# Patient Record
Sex: Male | Born: 2000 | Race: Black or African American | Hispanic: No | Marital: Single | Smoking: Never smoker
Health system: Southern US, Community
[De-identification: ages and names within clinical notes are randomized; demographics above are authoritative.]

## PROBLEM LIST (undated history)

## (undated) DIAGNOSIS — M8430XA Stress fracture, unspecified site, initial encounter for fracture: Secondary | ICD-10-CM

## (undated) HISTORY — PX: ADENOIDECTOMY: SUR15

## (undated) HISTORY — PX: MYRINGOTOMY WITH TUBE PLACEMENT: SHX5663

## (undated) HISTORY — PX: TYMPANOSTOMY TUBE PLACEMENT: SHX32

## (undated) HISTORY — PX: TONSILLECTOMY: SUR1361

---
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
## 2021-10-17 ENCOUNTER — Emergency Department
Admission: EM | Admit: 2021-10-17 | Discharge: 2021-10-17 | Disposition: A | Discharge status: Home / Self Care | Payer: BC Managed Care – PPO | Attending: Family Medicine | Admitting: Family Medicine

## 2021-10-17 DIAGNOSIS — H6123 Impacted cerumen, bilateral: Secondary | ICD-10-CM | POA: Diagnosis not present

## 2021-10-17 DIAGNOSIS — H6692 Otitis media, unspecified, left ear: Secondary | ICD-10-CM

## 2021-10-17 MED ORDER — AMOXICILLIN-POT CLAVULANATE 875-125 MG PO TABS: 1.0000 | ORAL_TABLET | Freq: Two times a day (BID) | ORAL | 0 refills | Status: AC

## 2021-10-17 NOTE — ED Provider Notes (Signed)
[facility]    CSN: [id number] Arrival date & time: 10/17/21  1147      History   Chief Complaint Chief Complaint  Patient presents with   Ear Fullness    HPI Ethan [facility] is a 21 y.o. male.   HPI Pleasant 21 year old male presents with bilateral ear fullness with decreased hearing for 1.5 weeks.  Denies ear pain.  Past Medical History:  Diagnosis Date   Stress fracture    left hip    Patient Active Problem List   Diagnosis Date Noted   Right wrist sprain 08/16/2013    Past Surgical History:  Procedure Laterality Date   ADENOIDECTOMY     MYRINGOTOMY WITH TUBE PLACEMENT     TONSILLECTOMY     TYMPANOSTOMY TUBE PLACEMENT     childhood       Home Medications    Prior to Admission medications   Medication Sig Start Date End Date Taking? Authorizing Provider  amoxicillin-clavulanate (AUGMENTIN) 875-125 MG tablet Take 1 tablet by mouth 2 (two) times daily for 7 days. 10/17/21 10/24/21 Yes Trevor Iha, FNP    Family History Family History  Problem Relation Age of Onset   Hypertension Mother    Healthy Father     Social History Social History   Tobacco Use   Smoking status: Never   Smokeless tobacco: Never  Vaping Use   Vaping Use: Never used  Substance Use Topics   Alcohol use: No   Drug use: No     Allergies   Patient has no known allergies.   Review of Systems Review of Systems  HENT:  Positive for ear pain.   All other systems reviewed and are negative.    Physical Exam Triage Vital Signs ED Triage Vitals  Enc Vitals Group     BP      Pulse      Resp      Temp      Temp src      SpO2      Weight      Height      Head Circumference      Peak Flow      Pain Score      Pain Loc      Pain Edu?      Excl. in GC?    No data found.  Updated Vital Signs BP (!) 145/82   Pulse (!) 55   Temp 98.9 F (37.2 C) (Oral)   Resp 18   Ht 5\' 9"  (1.753 m)   Wt 215 lb (97.5 kg)   SpO2 100%   BMI 31.75 kg/m     Physical Exam Vitals and nursing note reviewed.  Constitutional:      Appearance: Normal appearance. He is normal weight.  HENT:     Head: Normocephalic and atraumatic.     Right Ear: External ear normal.     Left Ear: External ear normal.     Ears:     Comments: Bilateral EACs occluded by excessive cerumen unable to visualize either TM; post bilateral ear lavage: EAC's are clear bilaterally; left TM-erythematous, bulging; right TM-clear, retracted, with scant serous effusions noted    Mouth/Throat:     Mouth: Mucous membranes are moist.     Pharynx: Oropharynx is clear.  Eyes:     Extraocular Movements: Extraocular movements intact.     Conjunctiva/sclera: Conjunctivae normal.     Pupils: Pupils are equal, round, and reactive to light.  Cardiovascular:  Rate and Rhythm: Normal rate and regular rhythm.     Pulses: Normal pulses.     Heart sounds: Normal heart sounds. No murmur heard. Pulmonary:     Effort: Pulmonary effort is normal.     Breath sounds: Normal breath sounds. No wheezing, rhonchi or rales.  Musculoskeletal:     Cervical back: Normal range of motion and neck supple.  Skin:    General: Skin is warm and dry.  Neurological:     General: No focal deficit present.     Mental Status: He is alert and oriented to person, place, and time. Mental status is at baseline.      UC Treatments / Results  Labs (all labs ordered are listed, but only abnormal results are displayed) Labs Reviewed - No data to display  EKG   Radiology No results found.  Procedures Procedures (including critical care time)  Medications Ordered in UC Medications - No data to display  Initial Impression / Assessment and Plan / UC Course  I have reviewed the triage vital signs and the nursing notes.  Pertinent labs & imaging results that were available during my care of the patient were reviewed by me and considered in my medical decision making (see chart for details).      MDM: 1.  Bilateral impacted cerumen-resolved with bilateral ear lavage; 2.  Acute left otitis media-Rx'd Augmentin. Advised/informed the earwax was completely cleared from both auditory canals after lavage.  Advised patient that he has a left middle ear infection and to take medication as directed with food to completion.  Encouraged patient increase daily water intake while taking this medication.  Advised patient if symptoms worsen and/or unresolved please follow-up with PCP or here for further evaluation.  Patient discharged home, hemodynamically stable Final Clinical Impressions(s) / UC Diagnoses   Final diagnoses:  Bilateral impacted cerumen  Acute left otitis media     Discharge Instructions      Advised/informed the earwax was completely cleared from both auditory canals after lavage.  Advised patient that he has a left middle ear infection and to take medication as directed with food to completion.  Encouraged patient increase daily water intake while taking this medication.  Advised patient if symptoms worsen and/or unresolved please follow-up with PCP or here for further evaluation.     ED Prescriptions     Medication Sig Dispense Auth. Provider   amoxicillin-clavulanate (AUGMENTIN) 875-125 MG tablet Take 1 tablet by mouth 2 (two) times daily for 7 days. 14 tablet Trevor Iha, FNP      PDMP not reviewed this encounter.   Trevor Iha, FNP 10/17/21 1303

## 2021-10-17 NOTE — Discharge Instructions (Addendum)
Advised/informed the earwax was completely cleared from both auditory canals after lavage.  Advised patient that he has a left middle ear infection and to take medication as directed with food to completion.  Encouraged patient increase daily water intake while taking this medication.  Advised patient if symptoms worsen and/or unresolved please follow-up with PCP or here for further evaluation.

## 2021-10-17 NOTE — ED Triage Notes (Signed)
Pt presents to Urgent Care with c/o bilateral ear fullness w/ decreased hearing x 1.5 weeks. Denies pain.

## 2021-10-18 ENCOUNTER — Telehealth: Payer: Self-pay

## 2021-10-18 NOTE — Telephone Encounter (Signed)
Follow-up call for yesterday's visit at [facility]. No answer, VM not set up.

## 2022-08-05 ENCOUNTER — Ambulatory Visit
Admission: EM | Admit: 2022-08-05 | Discharge: 2022-08-05 | Disposition: A | Discharge status: Home / Self Care | Payer: BC Managed Care – PPO | Attending: Emergency Medicine | Admitting: Emergency Medicine

## 2022-08-05 DIAGNOSIS — Z113 Encounter for screening for infections with a predominantly sexual mode of transmission: Secondary | ICD-10-CM | POA: Insufficient documentation

## 2022-08-05 NOTE — ED Triage Notes (Signed)
Pt here today for STD screening . Requesting only cytology swab. Denies sxs or known exposure.

## 2022-08-05 NOTE — Discharge Instructions (Signed)
The results of your STD testing today which screens for gonorrhea, chlamydia, and trichomonas will be made posted to your MyChart account once it is complete.  This typically takes 2 to 4 days.  Please abstain from sexual intercourse of any kind, vaginal, oral or anal, until you have received the results of your STD testing.     If any of your results are abnormal, you will receive a phone call regarding treatment.  Prescriptions, if any are needed, will be provided for you at your pharmacy.     Thank you for visiting urgent care today.  I appreciate the opportunity to participate in your care.  

## 2022-08-05 NOTE — ED Provider Notes (Signed)
[facility]    CSN: [id number] Arrival date & time: 08/05/22  1756    HISTORY  No chief complaint on file.  HPI Ethan Bishop is a pleasant, 22 y.o. male who presents to urgent care today. Pt here today for STD screening . Requesting only cytology swab. Denies sxs or known exposure.   The history is provided by the patient.   Past Medical History:  Diagnosis Date   Stress fracture    left hip   Patient Active Problem List   Diagnosis Date Noted   Right wrist sprain 08/16/2013   Past Surgical History:  Procedure Laterality Date   ADENOIDECTOMY     MYRINGOTOMY WITH TUBE PLACEMENT     TONSILLECTOMY     TYMPANOSTOMY TUBE PLACEMENT     childhood    Home Medications    Prior to Admission medications   Not on File    Family History Family History  Problem Relation Age of Onset   Hypertension Mother    Healthy Father    Social History Social History   Tobacco Use   Smoking status: Never   Smokeless tobacco: Never  Vaping Use   Vaping Use: Never used  Substance Use Topics   Alcohol use: No   Drug use: No   Allergies   Patient has no known allergies.  Review of Systems Review of Systems Pertinent findings revealed after performing a 14 point review of systems has been noted in the history of present illness.  Physical Exam Vital Signs BP 133/80 (BP Location: Right Arm)   Pulse 69   Temp 98.7 F (37.1 C) (Oral)   Resp 16   SpO2 100%   No data found.  Physical Exam Vitals and nursing note reviewed.  Constitutional:      General: He is not in acute distress.    Appearance: Normal appearance. He is not ill-appearing.  HENT:     Head: Normocephalic and atraumatic.  Eyes:     General: Lids are normal.        Right eye: No discharge.        Left eye: No discharge.     Extraocular Movements: Extraocular movements intact.     Conjunctiva/sclera: Conjunctivae normal.     Right eye: Right conjunctiva is not injected.     Left eye:  Left conjunctiva is not injected.  Neck:     Trachea: Trachea and phonation normal.  Cardiovascular:     Rate and Rhythm: Normal rate and regular rhythm.     Pulses: Normal pulses.     Heart sounds: Normal heart sounds. No murmur heard.    No friction rub. No gallop.  Pulmonary:     Effort: Pulmonary effort is normal. No accessory muscle usage, prolonged expiration or respiratory distress.     Breath sounds: Normal breath sounds. No stridor, decreased air movement or transmitted upper airway sounds. No decreased breath sounds, wheezing, rhonchi or rales.  Chest:     Chest wall: No tenderness.  Genitourinary:    Comments: Pt politely declines GU exam, pt did provide a penile swab for testing.   Musculoskeletal:        General: Normal range of motion.     Cervical back: Normal range of motion and neck supple. Normal range of motion.  Lymphadenopathy:     Cervical: No cervical adenopathy.  Skin:    General: Skin is warm and dry.     Findings: No erythema or rash.  Neurological:  General: No focal deficit present.     Mental Status: He is alert and oriented to person, place, and time.  Psychiatric:        Mood and Affect: Mood normal.        Behavior: Behavior normal.     Visual Acuity Right Eye Distance:   Left Eye Distance:   Bilateral Distance:    Right Eye Near:   Left Eye Near:    Bilateral Near:     UC Couse / Diagnostics / Procedures:     Radiology No results found.  Procedures Procedures (including critical care time) EKG  Pending results:  Labs Reviewed  CYTOLOGY, (ORAL, ANAL, URETHRAL) ANCILLARY ONLY    Medications Ordered in UC: Medications - No data to display  UC Diagnoses / Final Clinical Impressions(s)   I have reviewed the triage vital signs and the nursing notes.  Pertinent labs & imaging results that were available during my care of the patient were reviewed by me and considered in my medical decision making (see chart for details).     Final diagnoses:  Screening examination for STD (sexually transmitted disease)   STD screening was performed, patient advised that the results be posted to their MyChart and if any of the results are positive, they will be notified by phone, further treatment will be provided as indicated based on results of STD screening. Patient was advised to abstain from sexual intercourse until that they receive the results of their STD testing.  Patient was also advised to use condoms to protect themselves from STD exposure.  Please see discharge instructions below for details of plan of care as provided to patient. ED Prescriptions   None    PDMP not reviewed this encounter.  Disposition Upon Discharge:  Condition: stable for discharge home  Patient presented with concern for an acute illness with associated systemic symptoms and significant discomfort requiring urgent management. In my opinion, this is a condition that a prudent lay person (someone who possesses an average knowledge of health and medicine) may potentially expect to result in complications if not addressed urgently such as respiratory distress, impairment of bodily function or dysfunction of bodily organs.   As such, the patient has been evaluated and assessed, work-up was performed and treatment was provided in alignment with urgent care protocols and evidence based medicine.  Patient/parent/caregiver has been advised that the patient may require follow up for further testing and/or treatment if the symptoms continue in spite of treatment, as clinically indicated and appropriate.  Routine symptom specific, illness specific and/or disease specific instructions were discussed with the patient and/or caregiver at length.  Prevention strategies for avoiding STD exposure were also discussed.  The patient will follow up with their current PCP if and as advised. If the patient does not currently have a PCP we will assist them in obtaining  one.   The patient may need specialty follow up if the symptoms continue, in spite of conservative treatment and management, for further workup, evaluation, consultation and treatment as clinically indicated and appropriate.  Patient/parent/caregiver verbalized understanding and agreement of plan as discussed.  All questions were addressed during visit.  Please see discharge instructions below for further details of plan.  Discharge Instructions:   Discharge Instructions      The results of your STD testing today which screens for gonorrhea, chlamydia, and trichomonas will be made posted to your MyChart account once it is complete.  This typically takes 2 to 4 days.  Please  abstain from sexual intercourse of any kind, vaginal, oral or anal, until you have received the results of your STD testing.     If any of your results are abnormal, you will receive a phone call regarding treatment.  Prescriptions, if any are needed, will be provided for you at your pharmacy.     Thank you for visiting urgent care today.  I appreciate the opportunity to participate in your care.       This office note has been dictated using Teaching laboratory technician.  Unfortunately, this method of dictation can sometimes lead to typographical or grammatical errors.  I apologize for your inconvenience in advance if this occurs.  Please do not hesitate to reach out to me if clarification is needed.       Theadora Rama Scales, [place] 08/05/22 1835

## 2022-08-07 LAB — CYTOLOGY, (ORAL, ANAL, URETHRAL) ANCILLARY ONLY
Chlamydia: NEGATIVE
Comment: NEGATIVE
Comment: NEGATIVE
Comment: NORMAL
Neisseria Gonorrhea: NEGATIVE
Trichomonas: NEGATIVE

## 2024-02-25 ENCOUNTER — Encounter: Payer: Self-pay | Admitting: Emergency Medicine

## 2024-02-25 ENCOUNTER — Ambulatory Visit
Admission: EM | Admit: 2024-02-25 | Discharge: 2024-02-25 | Disposition: A | Discharge status: Home / Self Care | Attending: Family Medicine | Admitting: Family Medicine

## 2024-02-25 DIAGNOSIS — Z202 Contact with and (suspected) exposure to infections with a predominantly sexual mode of transmission: Secondary | ICD-10-CM | POA: Diagnosis not present

## 2024-02-25 DIAGNOSIS — Z9189 Other specified personal risk factors, not elsewhere classified: Secondary | ICD-10-CM | POA: Insufficient documentation

## 2024-02-25 NOTE — ED Triage Notes (Signed)
 Patient requesting STI testing.  Denies any penile discharge or irritation.

## 2024-02-25 NOTE — Discharge Instructions (Signed)
 Your test have been sent to the laboratory for analysis The results will be available in MyChart You will be called if any of the tests are positive

## 2024-02-25 NOTE — ED Provider Notes (Signed)
 [facility]    CSN: [id number] Arrival date & time: 02/25/24  1210      History   Chief Complaint Chief Complaint  Patient presents with   Exposure to STD    HPI Ethan Bishop is a 23 y.o. male.   HPI  Patient is undergoing new relationship and desires STD testing.  He has no symptoms.  He does endorse unprotected sexual relations that place and at risk  Past Medical History:  Diagnosis Date   Stress fracture    left hip    Patient Active Problem List   Diagnosis Date Noted   Right wrist sprain 08/16/2013    Past Surgical History:  Procedure Laterality Date   ADENOIDECTOMY     MYRINGOTOMY WITH TUBE PLACEMENT     TONSILLECTOMY     TYMPANOSTOMY TUBE PLACEMENT     childhood       Home Medications    Prior to Admission medications   Not on File    Family History Family History  Problem Relation Age of Onset   Hypertension Mother    Healthy Father     Social History Social History   Tobacco Use   Smoking status: Never   Smokeless tobacco: Never  Vaping Use   Vaping status: Never Used  Substance Use Topics   Alcohol use: No   Drug use: Yes    Types: Marijuana     Allergies   Patient has no known allergies.   Review of Systems Review of Systems See HPI  Physical Exam Triage Vital Signs ED Triage Vitals  Encounter Vitals Group     BP 02/25/24 1241 113/69     Girls Systolic BP Percentile --      Girls Diastolic BP Percentile --      Boys Systolic BP Percentile --      Boys Diastolic BP Percentile --      Pulse Rate 02/25/24 1241 (!) 53     Resp 02/25/24 1241 18     Temp 02/25/24 1241 98.7 F (37.1 C)     Temp Source 02/25/24 1241 Oral     SpO2 02/25/24 1241 95 %     Weight 02/25/24 1240 205 lb (93 kg)     Height 02/25/24 1240 5' 9 (1.753 m)     Head Circumference --      Peak Flow --      Pain Score 02/25/24 1240 0     Pain Loc --      Pain Education --      Exclude from Growth Chart --    No data  found.  Updated Vital Signs BP 113/69 (BP Location: Right Arm)   Pulse (!) 53   Temp 98.7 F (37.1 C) (Oral)   Resp 18   Ht 5' 9 (1.753 m)   Wt 93 kg   SpO2 95%   BMI 30.27 kg/m      Physical Exam Constitutional:      General: He is not in acute distress.    Appearance: He is well-developed and normal weight.  HENT:     Head: Normocephalic and atraumatic.  Eyes:     Conjunctiva/sclera: Conjunctivae normal.     Pupils: Pupils are equal, round, and reactive to light.  Cardiovascular:     Rate and Rhythm: Normal rate.  Pulmonary:     Effort: Pulmonary effort is normal. No respiratory distress.  Musculoskeletal:        General: Normal range of motion.  Cervical back: Normal range of motion.  Skin:    General: Skin is warm and dry.  Neurological:     Mental Status: He is alert.      UC Treatments / Results  Labs (all labs ordered are listed, but only abnormal results are displayed) Labs Reviewed  SYPHILIS: RPR W/REFLEX TO RPR TITER AND TREPONEMAL ANTIBODIES, TRADITIONAL SCREENING AND DIAGNOSIS ALGORITHM  HIV ANTIBODY (ROUTINE TESTING W REFLEX)  CYTOLOGY, (ORAL, ANAL, URETHRAL) ANCILLARY ONLY    EKG   Radiology No results found.  Procedures Procedures (including critical care time)  Medications Ordered in UC Medications - No data to display  Initial Impression / Assessment and Plan / UC Course  I have reviewed the triage vital signs and the nursing notes.  Pertinent labs & imaging results that were available during my care of the patient were reviewed by me and considered in my medical decision making (see chart for details).     STD testing discussed.  Advised abstinence until results are available. Final Clinical Impressions(s) / UC Diagnoses   Final diagnoses:  At risk for sexually transmitted disease due to unprotected sex     Discharge Instructions      Your test have been sent to the laboratory for analysis The results will be  available in MyChart You will be called if any of the tests are positive   ED Prescriptions   None    PDMP not reviewed this encounter.   Maranda Jamee Jacob, MD 02/25/24 [phone]

## 2024-02-26 LAB — HIV ANTIBODY (ROUTINE TESTING W REFLEX): HIV Screen 4th Generation wRfx: NONREACTIVE

## 2024-02-26 LAB — SYPHILIS: RPR W/REFLEX TO RPR TITER AND TREPONEMAL ANTIBODIES, TRADITIONAL SCREENING AND DIAGNOSIS ALGORITHM: RPR Ser Ql: NONREACTIVE

## 2024-03-01 ENCOUNTER — Ambulatory Visit (HOSPITAL_COMMUNITY): Payer: Self-pay

## 2024-03-01 LAB — CYTOLOGY, (ORAL, ANAL, URETHRAL) ANCILLARY ONLY
Chlamydia: POSITIVE — AB
Comment: NEGATIVE
Comment: NEGATIVE
Comment: NORMAL
Neisseria Gonorrhea: NEGATIVE
Trichomonas: NEGATIVE

## 2024-03-01 MED ORDER — DOXYCYCLINE HYCLATE 100 MG PO TABS: 100.0000 mg | ORAL_TABLET | Freq: Two times a day (BID) | ORAL | 0 refills | Status: AC

## 2024-03-05 ENCOUNTER — Telehealth: Payer: Self-pay

## 2024-03-05 NOTE — Telephone Encounter (Signed)
 Pt called Urgent Care to inquire about prescription and test results . Pt educated on same and verbalized understanding.
